# Patient Record
Sex: Male | Born: 1990 | Hispanic: No | Marital: Single | Smoking: Former smoker
Health system: Southern US, Community
[De-identification: ages and names within clinical notes are randomized; demographics above are authoritative.]

## PROBLEM LIST (undated history)

## (undated) DIAGNOSIS — T7840XA Allergy, unspecified, initial encounter: Secondary | ICD-10-CM

## (undated) HISTORY — DX: Allergy, unspecified, initial encounter: T78.40XA

## (undated) HISTORY — PX: NO PAST SURGERIES: SHX2092

---
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
## 2017-08-09 ENCOUNTER — Ambulatory Visit: Payer: Self-pay | Admitting: Family Medicine

## 2017-08-14 ENCOUNTER — Ambulatory Visit (INDEPENDENT_AMBULATORY_CARE_PROVIDER_SITE_OTHER): Payer: Managed Care, Other (non HMO) | Admitting: Family Medicine

## 2017-08-14 ENCOUNTER — Encounter: Payer: Self-pay | Admitting: Family Medicine

## 2017-08-14 ENCOUNTER — Other Ambulatory Visit (HOSPITAL_COMMUNITY)
Admission: RE | Admit: 2017-08-14 | Discharge: 2017-08-14 | Disposition: A | Discharge status: Home / Self Care | Payer: Managed Care, Other (non HMO) | Source: Ambulatory Visit | Attending: Family Medicine | Admitting: Family Medicine

## 2017-08-14 VITALS — BP 109/71 | HR 55 | Temp 97.9°F | Resp 20 | Ht 72.0 in | Wt 210.5 lb

## 2017-08-14 DIAGNOSIS — Z114 Encounter for screening for human immunodeficiency virus [HIV]: Secondary | ICD-10-CM | POA: Diagnosis not present

## 2017-08-14 DIAGNOSIS — Z7689 Persons encountering health services in other specified circumstances: Secondary | ICD-10-CM | POA: Diagnosis not present

## 2017-08-14 DIAGNOSIS — Z113 Encounter for screening for infections with a predominantly sexual mode of transmission: Secondary | ICD-10-CM | POA: Insufficient documentation

## 2017-08-14 DIAGNOSIS — Z0000 Encounter for general adult medical examination without abnormal findings: Secondary | ICD-10-CM

## 2017-08-14 DIAGNOSIS — E663 Overweight: Secondary | ICD-10-CM

## 2017-08-14 NOTE — Patient Instructions (Signed)

## 2017-08-14 NOTE — Progress Notes (Signed)
Patient ID: Rodney Bell, male  DOB: [birth year], 27 y.o.   MRN: [id number] Patient Care Team    Relationship Specialty Notifications Start End  Natalia Leatherwood, DO PCP - General Family Medicine  08/14/17     Chief Complaint  Patient presents with  . Establish Care  . Annual Exam    Subjective:  Rodney Bell is a 27 y.o.  male present for new patient establishment/CPE. All past medical history, surgical history, allergies, family history, immunizations, medications and social history were updated in the electronic medical record today. All recent labs, ED visits and hospitalizations within the last year were reviewed.  Health maintenance:  Colonoscopy: no fhx. Routine screen Immunizations: tdap UTD 2013, Influenza  (encouraged yearly) Infectious disease screening: HIV collected today, urine cytology collected today PSA: routine screen.  Assistive device: none Oxygen WUJ:WJXB Patient has a Dental home. Hospitalizations/ED visits: reviewed  Depression screen [facility] 2/9 08/14/2017  Decreased Interest 0  Down, Depressed, Hopeless 0  PHQ - 2 Score 0   No flowsheet data found.  Current Exercise Habits: Home exercise routine, Type of exercise: Other - see comments(sports), Time (Minutes): 60, Frequency (Times/Week): 5, Weekly Exercise (Minutes/Week): 300, Intensity: Moderate   Fall Risk  08/14/2017  Falls in the past year? No     There is no immunization history on file for this patient.  No exam data present  Past Medical History:  Diagnosis Date  . Allergy    No Known Allergies Past Surgical History:  Procedure Laterality Date  . NO PAST SURGERIES     Family History  Problem Relation Age of Onset  . Asthma Sister   . Asthma Brother    Social History   Socioeconomic History  . Marital status: Single    Spouse name: Not on file  . Number of children: Not on file  . Years of education: Not on file  . Highest education level: Not on file  Occupational History    . Not on file  Social Needs  . Financial resource strain: Not on file  . Food insecurity:    Worry: Not on file    Inability: Not on file  . Transportation needs:    Medical: Not on file    Non-medical: Not on file  Tobacco Use  . Smoking status: Former Smoker    Packs/day: 0.25    Types: Cigarettes, Cigars    Last attempt to quit: 2017    Years since quitting: 2.4  . Smokeless tobacco: Never Used  Substance and Sexual Activity  . Alcohol use: Not Currently    Frequency: Never  . Drug use: Never  . Sexual activity: Yes    Partners: Female  Lifestyle  . Physical activity:    Days per week: Not on file    Minutes per session: Not on file  . Stress: Not on file  Relationships  . Social connections:    Talks on phone: Not on file    Gets together: Not on file    Attends religious service: Not on file    Active member of club or organization: Not on file    Attends meetings of clubs or organizations: Not on file    Relationship status: Not on file  . Intimate partner violence:    Fear of current or ex partner: Not on file    Emotionally abused: Not on file    Physically abused: Not on file    Forced sexual activity: Not on file  Other Topics Concern  . Not on file  Social History Narrative   Single. Senior in college. Studying Engineering.    Works as a [employer] Teacher, [employer] ground package handler.   Exercises routinely > 3x weekly.    Smoke alarm in the home.    Wears seatbelt.    Feels safe in his relationships.    Allergies as of 08/14/2017   No Known Allergies     Medication List    as of 08/14/2017  5:33 PM   You have not been prescribed any medications.     All past medical history, surgical history, allergies, family history, immunizations andmedications were updated in the EMR today and reviewed under the history and medication portions of their EMR.    No results found for this or any previous visit (from the past 2160 hour(s)).   ROS: 14 pt review of systems  performed and negative (unless mentioned in an HPI)  Objective: BP 109/71 (BP Location: Left Arm, Patient Position: Sitting, Cuff Size: Large)   Pulse (!) 55   Temp 97.9 F (36.6 C)   Resp 20   Ht 6' (1.829 m)   Wt 210 lb 8 oz (95.5 kg)   SpO2 97%   BMI 28.55 kg/m  Gen: Afebrile. No acute distress. Nontoxic in appearance, well-developed, well-nourished,  Pleasant physically fit male.  HENT: AT. . Bilateral TM visualized and normal in appearance, normal external auditory canal. MMM, no oral lesions, adequate dentition. Bilateral nares within normal limits. Throat without erythema, ulcerations or exudates. no Cough on exam, no hoarseness on exam. Eyes:Pupils Equal Round Reactive to light, Extraocular movements intact,  Conjunctiva without redness, discharge or icterus. Neck/lymp/endocrine: Supple,no lymphadenopathy, no thyromegaly CV: RRR no murmur, no edema, +2/4 P posterior tibialis pulses. no carotid bruits. No JVD. Chest: CTAB, no wheeze, rhonchi or crackles. normal Respiratory effort. good Air movement. Abd: Soft. flat. NTND. BS present. no Masses palpated. No hepatosplenomegaly. No rebound tenderness or guarding. Skin: no rashes, purpura or petechiae. Warm and well-perfused. Skin intact. Neuro/Msk:  Normal gait. PERLA. EOMi. Alert. Oriented x3.  Cranial nerves II through XII intact. Muscle strength 5/5 upper/lower extremity. DTRs equal bilaterally. Psych: Normal affect, dress and demeanor. Normal speech. Normal thought content and judgment.   Assessment/plan: Rodney Bell is a 27 y.o. male present for new pt/CPE. Overweight (BMI 25.0-29.9) Diet and exercise counseling. He is a physically active tall, muscular male. BMI of 28 is not likely overweight for him.  Encounter for screening for HIV - HIV antibody (with reflex) Screen for STD (sexually transmitted disease - Urine cytology ancillary only Encounter for preventive health examination/Encounter to establish care Patient was  encouraged to exercise greater than 150 minutes a week. Patient was encouraged to choose a diet filled with fresh fruits and vegetables, and lean meats. AVS provided to patient today for education/recommendation on gender specific health and safety maintenance. Discussed other potential screenings, anemia, diabetes, cholesterol which by age is not at risk, and he would like to wait. Colonoscopy: no fhx. Routine screen Immunizations: tdap UTD 2013, Influenza  (encouraged yearly) Infectious disease screening: HIV collected today, urine cytology collected today PSA: No fhx. routine screen.  Follow with routine dental appts. He has a dental home.    Return in about 1 year (around 08/15/2018) for CPE.   Note is dictated utilizing voice recognition software. Although note has been proof read prior to signing, occasional typographical errors still can be missed. If any questions arise, please do not hesitate to  call for verification.  Electronically signed by: Howard Pouch, DO [place]

## 2017-08-15 LAB — HIV ANTIBODY (ROUTINE TESTING W REFLEX): HIV: NONREACTIVE

## 2017-08-15 LAB — URINE CYTOLOGY ANCILLARY ONLY
CHLAMYDIA, DNA PROBE: NEGATIVE
Neisseria Gonorrhea: NEGATIVE

## 2017-09-27 ENCOUNTER — Ambulatory Visit (HOSPITAL_BASED_OUTPATIENT_CLINIC_OR_DEPARTMENT_OTHER)
Admission: RE | Admit: 2017-09-27 | Discharge: 2017-09-27 | Disposition: A | Discharge status: Home / Self Care | Payer: BLUE CROSS/BLUE SHIELD | Source: Ambulatory Visit | Attending: Medical | Admitting: Medical

## 2017-09-27 ENCOUNTER — Ambulatory Visit (INDEPENDENT_AMBULATORY_CARE_PROVIDER_SITE_OTHER): Payer: BLUE CROSS/BLUE SHIELD | Admitting: Medical

## 2017-09-27 ENCOUNTER — Encounter: Payer: Self-pay | Admitting: Medical

## 2017-09-27 VITALS — BP 125/62 | HR 58 | Temp 98.7°F | Ht 72.0 in | Wt 216.0 lb

## 2017-09-27 DIAGNOSIS — M25552 Pain in left hip: Secondary | ICD-10-CM | POA: Diagnosis present

## 2017-09-27 MED ORDER — DICLOFENAC SODIUM 75 MG PO TBEC: 75.0000 mg | DELAYED_RELEASE_TABLET | Freq: Two times a day (BID) | ORAL | 0 refills | Status: DC

## 2017-09-27 NOTE — Progress Notes (Signed)
Subjective:    Patient ID: Rodney Bell, male    DOB: Jul 14, 1990, 27 y.o.   MRN: [id number]  HPI  Pt in with some recent left hip pain. States started yesterday. Started at beginning of his shift at work. Then pain lasted all day.   Pt does clarify he has had pain in the past before. Last time pain occurred about 3-4 months ago. Back then he did some stretch to help it improve. In the past he does not remember any specific injury.  He explains no particuar fall or injury at work. He can't specifically say why he has pain.  In the past he did take ibuprofen and it helped him some.  No groin pain or bulge in groin per patient.  Review of Systems  Constitutional: Negative for chills, fatigue and fever.  Respiratory: Negative for cough, chest tightness, shortness of breath and wheezing.   Cardiovascular: Negative for chest pain and palpitations.  Gastrointestinal: Negative for abdominal distention, abdominal pain, anal bleeding, diarrhea and nausea.  Musculoskeletal: Negative for back pain and neck pain.       Lt hip area pain.  Skin: Negative for rash.  Neurological: Negative for dizziness, seizures, weakness and light-headedness.  Hematological: Negative for adenopathy. Does not bruise/bleed easily.  Psychiatric/Behavioral: Negative for behavioral problems, confusion and dysphoric mood. The patient is not nervous/anxious.     Past Medical History:  Diagnosis Date  . Allergy      Social History   Socioeconomic History  . Marital status: Single    Spouse name: Not on file  . Number of children: Not on file  . Years of education: Not on file  . Highest education level: Not on file  Occupational History  . Not on file  Social Needs  . Financial resource strain: Not on file  . Food insecurity:    Worry: Not on file    Inability: Not on file  . Transportation needs:    Medical: Not on file    Non-medical: Not on file  Tobacco Use  . Smoking status: Former Smoker   Packs/day: 0.25    Types: Cigarettes, Cigars    Last attempt to quit: 2017    Years since quitting: 2.5  . Smokeless tobacco: Never Used  Substance and Sexual Activity  . Alcohol use: Not Currently    Frequency: Never  . Drug use: Never  . Sexual activity: Yes    Partners: Female  Lifestyle  . Physical activity:    Days per week: Not on file    Minutes per session: Not on file  . Stress: Not on file  Relationships  . Social connections:    Talks on phone: Not on file    Gets together: Not on file    Attends religious service: Not on file    Active member of club or organization: Not on file    Attends meetings of clubs or organizations: Not on file    Relationship status: Not on file  . Intimate partner violence:    Fear of current or ex partner: Not on file    Emotionally abused: Not on file    Physically abused: Not on file    Forced sexual activity: Not on file  Other Topics Concern  . Not on file  Social History Narrative   Single. Senior in college. Studying Engineering.    Works as a [employer] Teacher, music.   Exercises routinely > 3x weekly.    Smoke alarm  in the home.    Wears seatbelt.    Feels safe in his relationships.     Past Surgical History:  Procedure Laterality Date  . NO PAST SURGERIES      Family History  Problem Relation Age of Onset  . Asthma Sister   . Asthma Brother     No Known Allergies  No current outpatient medications on file prior to visit.   No current facility-administered medications on file prior to visit.     BP 125/62 (BP Location: Left Arm, Patient Position: Sitting, Cuff Size: Large)   Pulse (!) 58   Temp 98.7 F (37.1 C) (Oral)   Ht 6' (1.829 m)   Wt 216 lb (98 kg)   SpO2 100%   BMI 29.29 kg/m      Objective:   Physical Exam  General- No acute distress. Pleasant patient. Neck- Full range of motion, no jvd Lungs- Clear, even and unlabored. Heart- regular rate and rhythm. Neurologic- CNII- XII  grossly intact.  Left hip- on range of motion some pain uppper thigh/hip pain area  that extend just lateral to anterio/superior portion of iliac crest.      Assessment & Plan:  For your left hip area pain, I do think that is a good idea to get hip x-ray in light of the fact that you have had pain in the past about 3 or 4 months ago and now the pain is recurrent.  I am prescribing diclofenac medication for pain and inflammation period. You can do hip stretching exercises as well.  Follow up in 7-10 days or as needed.  If pain persists then could offer your sports medicine referral  Esperanza RichtersEdward Nickalus Thornsberry, PA-C

## 2017-09-27 NOTE — Patient Instructions (Signed)
For your left hip area pain, I do think that is a good idea to get hip x-ray in light of the fact that you have had pain in the past about 3 or 4 months ago and now the pain is recurrent.  I am prescribing diclofenac medication for pain and inflammation period. You can do hip stretching exercises as well.  Follow up in 7-10 days or as needed.  If pain persists then could offer your sports medicine referral.

## 2017-10-29 ENCOUNTER — Encounter: Payer: Self-pay | Admitting: Family Medicine

## 2017-10-29 ENCOUNTER — Other Ambulatory Visit (HOSPITAL_COMMUNITY)
Admission: RE | Admit: 2017-10-29 | Discharge: 2017-10-29 | Disposition: A | Discharge status: Home / Self Care | Payer: BLUE CROSS/BLUE SHIELD | Source: Ambulatory Visit | Attending: Family Medicine | Admitting: Family Medicine

## 2017-10-29 ENCOUNTER — Ambulatory Visit (INDEPENDENT_AMBULATORY_CARE_PROVIDER_SITE_OTHER): Payer: BLUE CROSS/BLUE SHIELD | Admitting: Family Medicine

## 2017-10-29 VITALS — BP 127/76 | HR 74 | Resp 16 | Ht 72.0 in | Wt 222.0 lb

## 2017-10-29 DIAGNOSIS — Z202 Contact with and (suspected) exposure to infections with a predominantly sexual mode of transmission: Secondary | ICD-10-CM

## 2017-10-29 NOTE — Progress Notes (Signed)
Rodney Bell , 06-26-1990, 27 y.o., male MRN: [id number] Patient Care Team    Relationship Specialty Notifications Start End  Natalia LeatherwoodKuneff, Ceara Wrightson A, DO PCP - General Family Medicine  08/14/17     Chief Complaint  Patient presents with  . Exposure to STD    screening     Subjective: Pt presents for an OV with concerns of STD exposure. He states he has no symptoms. Denies lesions or penile drainage.  He had unprotected sex a few weeks ago and is worried of STD exposure and would like to be tested for gonorrhea and chlamydia. He does not want blood work today.   Depression screen PHQ 2/9 08/14/2017  Decreased Interest 0  Down, Depressed, Hopeless 0  PHQ - 2 Score 0    No Known Allergies Social History   Tobacco Use  . Smoking status: Former Smoker    Packs/day: 0.25    Types: Cigarettes, Cigars    Last attempt to quit: 2017    Years since quitting: 2.6  . Smokeless tobacco: Never Used  Substance Use Topics  . Alcohol use: Not Currently    Frequency: Never   Past Medical History:  Diagnosis Date  . Allergy    Past Surgical History:  Procedure Laterality Date  . NO PAST SURGERIES     Family History  Problem Relation Age of Onset  . Asthma Sister   . Asthma Brother    Allergies as of 10/29/2017   No Known Allergies     Medication List    as of 10/29/2017  1:44 PM   You have not been prescribed any medications.     All past medical history, surgical history, allergies, family history, immunizations andmedications were updated in the EMR today and reviewed under the history and medication portions of their EMR.     ROS: Negative, with the exception of above mentioned in HPI   Objective:  BP 127/76 (BP Location: Left Arm, Patient Position: Sitting, Cuff Size: Normal)   Pulse 74   Resp 16   Ht 6' (1.829 m)   Wt 222 lb (100.7 kg)   SpO2 97%   BMI 30.11 kg/m  Body mass index is 30.11 kg/m. Gen: Afebrile. No acute distress. Nontoxic in appearance, well  developed, well nourished.  HENT: AT. [place]. Bilateral TM visualized. MMM, no oral lesions.  Eyes:Pupils Equal Round Reactive to light, Extraocular movements intact,  Conjunctiva without redness, discharge or icterus. CV: RRR Abd: Soft. flat. NTND. BS present.  Skin: no rashes, purpura or petechiae.  Neuro:  Normal gait. PERLA. EOMi. Alert. Oriented x3 Psych: Normal affect, dress and demeanor. Normal speech. Normal thought content and judgment. GU: pt declined.   No exam data present No results found. No results found for this or any previous visit (from the past 24 hour(s)).  Assessment/Plan: Rodney Bell is a 27 y.o. male present for OV for  Screen for STD (sexually transmitted disease) Pt reports no symptoms, declined GU exam today.  - discussed signs/symptoms to monitor for suggestive of an STD. Also  Discussed many STDs can be without symptoms.  - urine cytology and HIV was normal June 2019.  - discussed safe sex and condom use.  - Urine cytology ancillary only - he declined blood work for HSV or RPR. He reports he will follow up if any lesions or symptoms develop.  - F/U PRN   Reviewed expectations re: course of current medical issues.  Discussed self-management of symptoms.  Outlined signs  and symptoms indicating need for more acute intervention.  Patient verbalized understanding and all questions were answered.  Patient received an After-Visit Summary.    No orders of the defined types were placed in this encounter.    Note is dictated utilizing voice recognition software. Although note has been proof read prior to signing, occasional typographical errors still can be missed. If any questions arise, please do not hesitate to call for verification.   electronically signed by:  Felix Pacinienee Jocelin Schuelke, DO  [facility] [place]

## 2017-10-29 NOTE — Patient Instructions (Signed)
We will test your urine today.    Safe Sex Practicing safe sex means taking steps before and during sex to reduce your risk of:  Getting an STD (sexually transmitted disease).  Giving your partner an STD.  Unwanted pregnancy.  How can I practice safe sex?  To practice safe sex:  Limit your sexual partners to only one partner who is having sex with only you.  Avoid using alcohol and recreational drugs before having sex. These substances can affect your judgment.  Before having sex with a new partner: ? Talk to your partner about past partners, past STDs, and drug use. ? You and your partner should be screened for STDs and discuss the results with each other.  Check your body regularly for sores, blisters, rashes, or unusual discharge. If you notice any of these problems, visit your health care provider.  If you have symptoms of an infection or you are being treated for an STD, avoid sexual contact.  While having sex, use a condom. Make sure to: ? Use a condom every time you have vaginal, oral, or anal sex. Both females and males should wear condoms during oral sex. ? Keep condoms in place from the beginning to the end of sexual activity. ? Use a latex condom, if possible. Latex condoms offer the best protection. ? Use only water-based lubricants or oils to lubricate a condom. Using petroleum-based lubricants or oils will weaken the condom and increase the chance that it will break.  See your health care provider for regular screenings, exams, and tests for STDs.  Talk with your health care provider about the form of birth control (contraception) that is best for you.  Get vaccinated against hepatitis B and human papillomavirus (HPV).  If you are at risk of being infected with HIV (human immunodeficiency virus), talk with your health care provider about taking a prescription medicine to prevent HIV infection. You are considered at risk for HIV if: ? You are a man who has sex  with other men. ? You are a heterosexual man or woman who is sexually active with more than one partner. ? You take drugs by injection. ? You are sexually active with a partner who has HIV.  This information is not intended to replace advice given to you by your health care provider. Make sure you discuss any questions you have with your health care provider. Document Released: 04/06/2004 Document Revised: 07/14/2015 Document Reviewed: 01/17/2015 Elsevier Interactive Patient Education  [employer]2018 [employer]

## 2017-10-30 LAB — URINE CYTOLOGY ANCILLARY ONLY
CHLAMYDIA, DNA PROBE: NEGATIVE
Neisseria Gonorrhea: NEGATIVE
Trichomonas: NEGATIVE

## 2018-08-16 ENCOUNTER — Encounter: Payer: Self-pay | Admitting: Family Medicine

## 2019-08-13 IMAGING — DX DG HIP (WITH OR WITHOUT PELVIS) 2-3V*L*
3 series · 3 of 3 positions shown · non-contrast
Comparison: None.

CLINICAL DATA: Left hip pain.  No known injury.

EXAM:
DG HIP (WITH OR WITHOUT PELVIS) 2-3V LEFT

[pelvis ap]
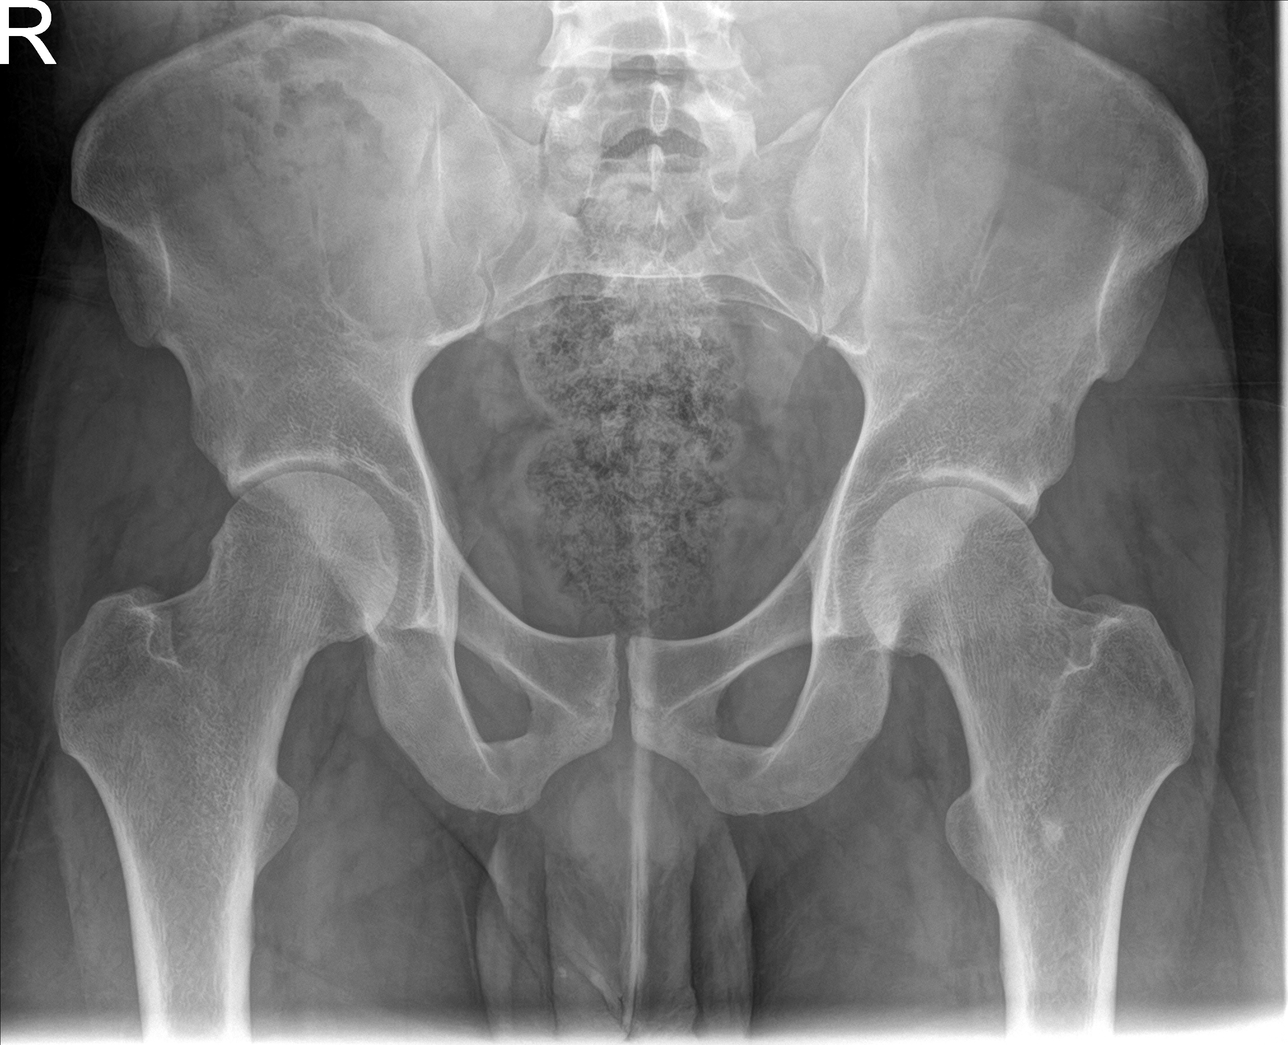

[hip ap]
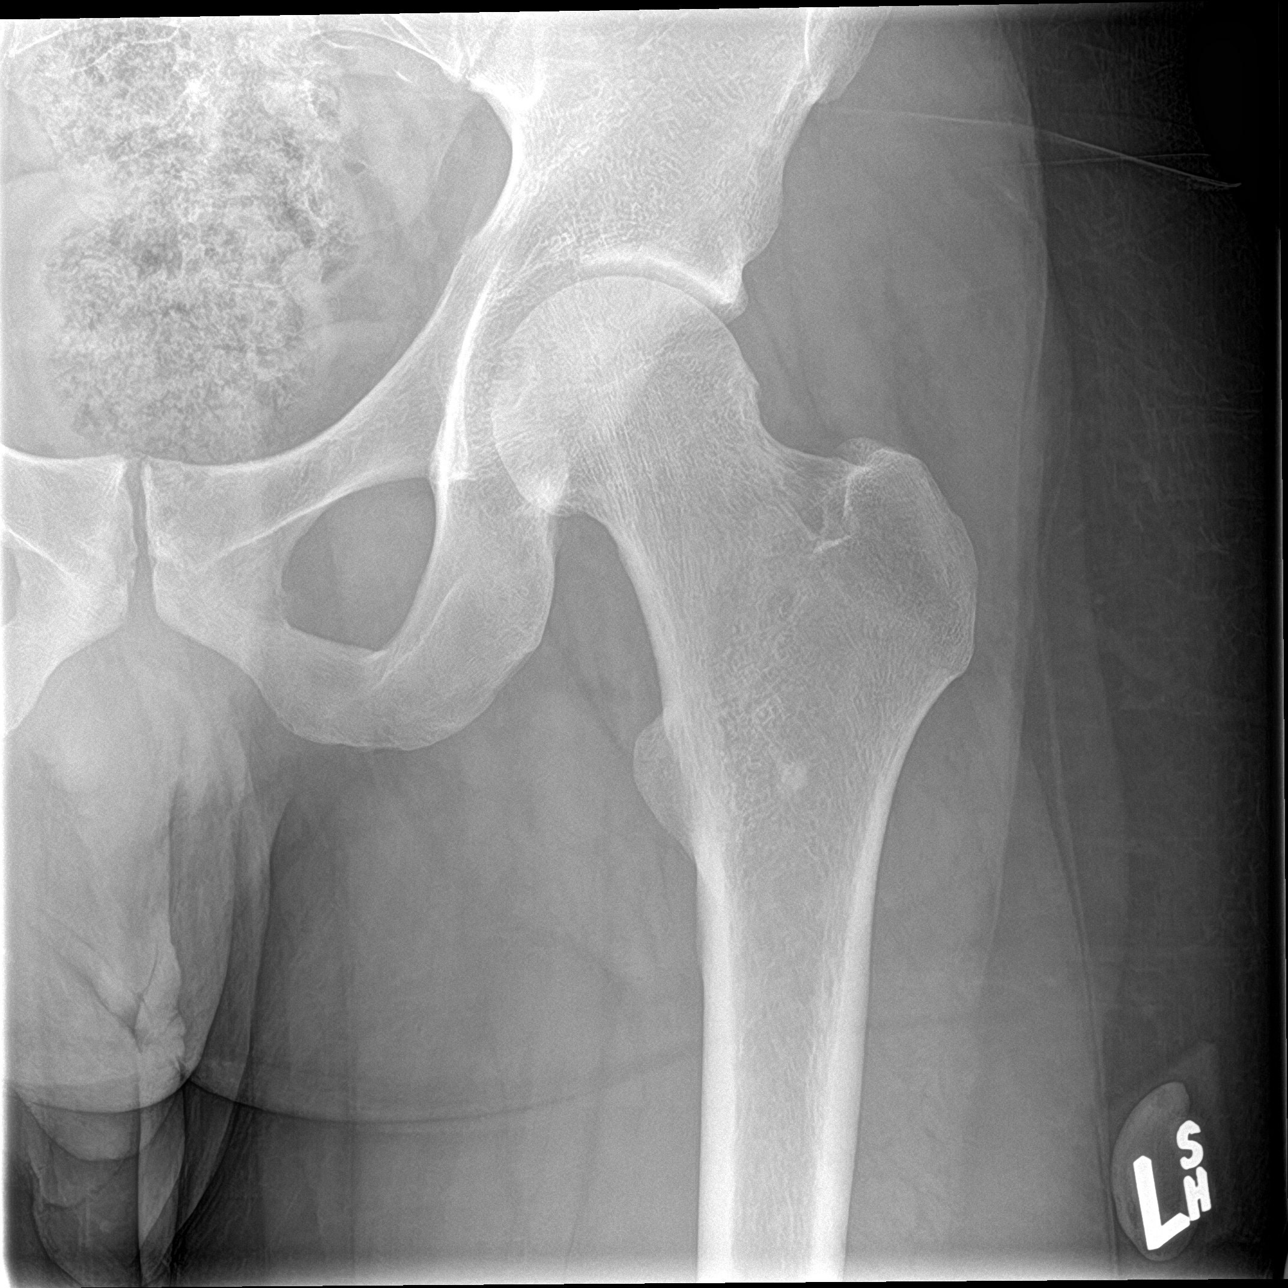

[hip lat]
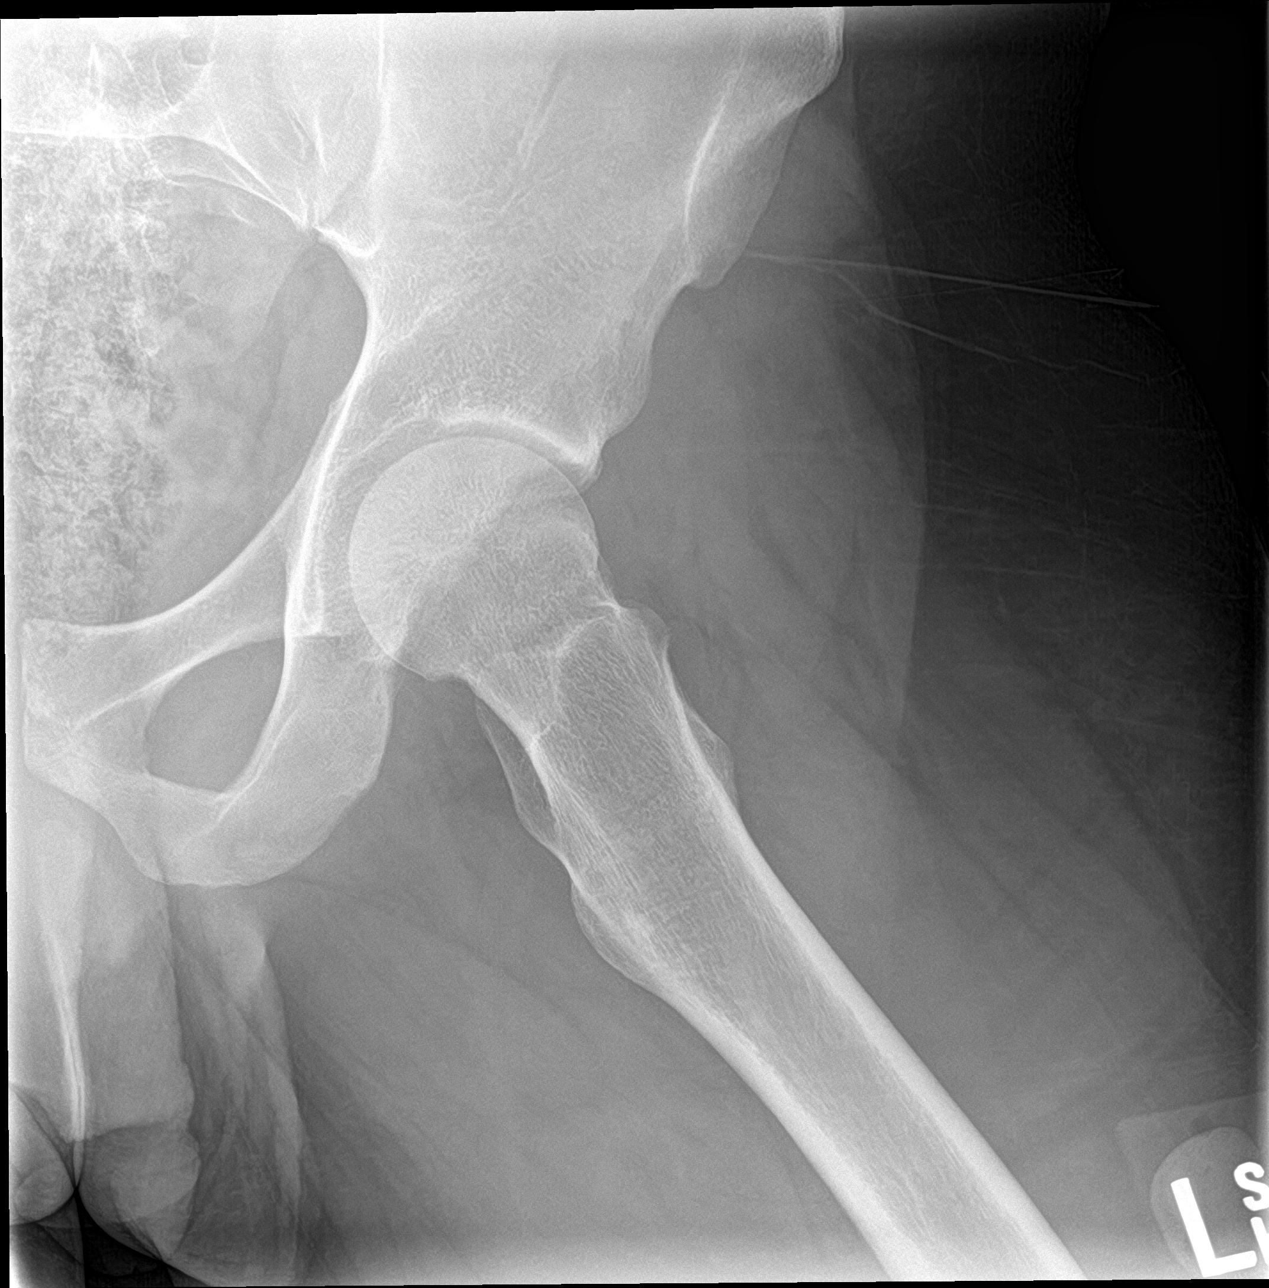

[3 of 3 positions shown; findings below may reference images not displayed]

FINDINGS: There is no evidence of hip fracture or dislocation. There is no
evidence of arthropathy or other focal bone abnormality. Hip joints
and SI joints are symmetric and unremarkable.
IMPRESSION: Negative.
